# Patient Record
Sex: Female | Born: 1955 | Race: White | Hispanic: No | Marital: Married | Smoking: Never smoker
Health system: Southern US, Community
[De-identification: ages and names within clinical notes are randomized; demographics above are authoritative.]

## PROBLEM LIST (undated history)

## (undated) DIAGNOSIS — E7800 Pure hypercholesterolemia, unspecified: Secondary | ICD-10-CM

## (undated) HISTORY — PX: ANKLE ARTHROSCOPY W/ OPEN REPAIR: SHX1145

## (undated) HISTORY — PX: BILATERAL CARPAL TUNNEL RELEASE: SHX6508

---

## 2000-10-15 ENCOUNTER — Other Ambulatory Visit: Admission: RE | Admit: 2000-10-15 | Discharge: 2000-10-15 | Payer: Self-pay | Admitting: Gynecology

## 2001-10-16 ENCOUNTER — Other Ambulatory Visit: Admission: RE | Admit: 2001-10-16 | Discharge: 2001-10-16 | Payer: Self-pay | Admitting: Gynecology

## 2002-11-18 ENCOUNTER — Other Ambulatory Visit: Admission: RE | Admit: 2002-11-18 | Discharge: 2002-11-18 | Payer: Self-pay | Admitting: Gynecology

## 2003-12-15 ENCOUNTER — Other Ambulatory Visit: Admission: RE | Admit: 2003-12-15 | Discharge: 2003-12-15 | Payer: Self-pay | Admitting: Gynecology

## 2005-05-22 ENCOUNTER — Other Ambulatory Visit: Admission: RE | Admit: 2005-05-22 | Discharge: 2005-05-22 | Payer: Self-pay | Admitting: Gynecology

## 2006-04-17 ENCOUNTER — Other Ambulatory Visit: Admission: RE | Admit: 2006-04-17 | Discharge: 2006-04-17 | Payer: Self-pay | Admitting: Gynecology

## 2007-05-26 ENCOUNTER — Other Ambulatory Visit: Admission: RE | Admit: 2007-05-26 | Discharge: 2007-05-26 | Payer: Self-pay | Admitting: Gynecology

## 2008-09-16 ENCOUNTER — Ambulatory Visit (HOSPITAL_BASED_OUTPATIENT_CLINIC_OR_DEPARTMENT_OTHER): Admission: RE | Admit: 2008-09-16 | Discharge: 2008-09-16 | Payer: Self-pay | Admitting: Gynecology

## 2008-09-16 ENCOUNTER — Encounter (INDEPENDENT_AMBULATORY_CARE_PROVIDER_SITE_OTHER): Payer: Self-pay | Admitting: Gynecology

## 2010-08-29 ENCOUNTER — Other Ambulatory Visit: Payer: Self-pay | Admitting: Gynecology

## 2010-09-20 LAB — POCT HEMOGLOBIN-HEMACUE: Hemoglobin: 14.9 g/dL (ref 12.0–15.0)

## 2010-09-20 LAB — POCT PREGNANCY, URINE: Preg Test, Ur: NEGATIVE

## 2010-10-05 ENCOUNTER — Other Ambulatory Visit: Payer: Self-pay | Admitting: Gastroenterology

## 2010-10-24 NOTE — Op Note (Signed)
Jenny Lucas, Jenny Lucas                  ACCOUNT NO.:  [id number]   MEDICAL RECORD NO.:  [id number]          PATIENT TYPE:  AMB   LOCATION:  [facility]                         FACILITY:  [facility]   PHYSICIAN:  Gretta Cool, M.D. DATE OF BIRTH:  1956-04-23   DATE OF PROCEDURE:  09/16/2008  DATE OF DISCHARGE:                               OPERATIVE REPORT   PREOPERATIVE DIAGNOSIS:  Endometrial polyp with recurrent abnormal  uterine bleeding, unresponsive.   POSTOPERATIVE DIAGNOSIS:  Endometrial polyp with recurrent abnormal  uterine bleeding, unresponsive.   PROCEDURE:  Hysteroscopy, resection of the endometrial polyp from the  lower uterine segment, total endometrial resection for ablation, plus  VaporTrode ablation.   SURGEON:  Beather Arbour.   ANESTHESIA:  IV sedation and paracervical block.   DESCRIPTION OF PROCEDURE:  Under excellent anesthesia as above with the  patient prepped and draped in Allen stirrups with her bladder drained,  cervix was grasped with a single-tooth tenaculum.  Weighted speculum was  placed in the posterior vagina and the cervix progressively dilated with  a series of Pratt dilators.  The 7-mm resectoscope was then introduced  and the endometrial cavity photographed.  There was a polyp at the  anterior [place] just inside the lower uterine segment.  That polyp was  resected.  The entire endometrial cavity was then resected.  The cavity  was then treated by VaporTrode so as to eliminate any surface or  superficial adenomyosis.  At this point, the procedure was terminated  without complication.  There was no significant bleeding that reduced  pressure.  The patient was returned to the recovery room in excellent  condition.           ______________________________  Gretta Cool, M.D.     CWL/MEDQ  D:  09/16/2008  T:  09/16/2008  Job:  [id number]   cc:   Gretta Cool, M.D.  Fax: [phone]

## 2011-09-18 ENCOUNTER — Other Ambulatory Visit: Payer: Self-pay | Admitting: Gynecology

## 2017-11-01 DIAGNOSIS — G5603 Carpal tunnel syndrome, bilateral upper limbs: Secondary | ICD-10-CM | POA: Insufficient documentation

## 2020-06-20 ENCOUNTER — Other Ambulatory Visit: Payer: Self-pay

## 2020-06-20 ENCOUNTER — Emergency Department
Admission: EM | Admit: 2020-06-20 | Discharge: 2020-06-20 | Disposition: A | Discharge status: Home / Self Care | Payer: BLUE CROSS/BLUE SHIELD | Source: Home / Self Care

## 2020-06-20 DIAGNOSIS — J069 Acute upper respiratory infection, unspecified: Secondary | ICD-10-CM

## 2020-06-20 DIAGNOSIS — R52 Pain, unspecified: Secondary | ICD-10-CM

## 2020-06-20 DIAGNOSIS — J020 Streptococcal pharyngitis: Secondary | ICD-10-CM | POA: Diagnosis not present

## 2020-06-20 LAB — POCT RAPID STREP A (OFFICE): Rapid Strep A Screen: POSITIVE — AB

## 2020-06-20 MED ORDER — AMOXICILLIN 500 MG PO CAPS: 500.0000 mg | ORAL_CAPSULE | Freq: Two times a day (BID) | ORAL | 0 refills | Status: AC

## 2020-06-20 NOTE — ED Triage Notes (Signed)
Pt c/o sore throat, fever (101 yesterday) and cough since Saturday afternoon. No known covid exposure. Has not had covid vaccines. Advil prn.

## 2020-06-20 NOTE — Discharge Instructions (Addendum)

## 2020-06-20 NOTE — ED Provider Notes (Signed)
[facility]    CSN: [id number] Arrival date & time: 06/20/20  0846      History   Chief Complaint Chief Complaint  Patient presents with  . Sore Throat  . Fever  . Cough    HPI Jenny Lucas is a 65 y.o. female.   HPI  Jenny Skyles is a 65 y.o. female presenting to UC with c/o sore throat, fever 101*F yesterday, cough for 3 days.  No known sick contacts. Has not had COVID vaccine. Denies n/v/d. No chest pain or SOB.    History reviewed. No pertinent past medical history.  There are no problems to display for this patient.   History reviewed. No pertinent surgical history.  OB History   No obstetric history on file.      Home Medications    Prior to Admission medications   Medication Sig Start Date End Date Taking? Authorizing Provider  amoxicillin (AMOXIL) 500 MG capsule Take 1 capsule (500 mg total) by mouth 2 (two) times daily for 10 days. 06/20/20 06/30/20 Yes Jonathyn Carothers O, PA-C  PARoxetine (PAXIL) 20 MG tablet Take 1 tablet by mouth daily. 04/18/20  Yes [provider]  rosuvastatin (CRESTOR) 20 MG tablet Take by mouth. 04/18/20  Yes [provider]    Family History History reviewed. No pertinent family history.  Social History Social History   Tobacco Use  . Smoking status: Never Smoker  . Smokeless tobacco: Never Used  Vaping Use  . Vaping Use: Never used  Substance Use Topics  . Alcohol use: Not Currently     Allergies   Patient has no known allergies.   Review of Systems Review of Systems  Constitutional: Positive for fatigue. Negative for chills and fever.  HENT: Positive for congestion and sore throat. Negative for ear pain, trouble swallowing and voice change.   Respiratory: Positive for cough. Negative for shortness of breath.   Cardiovascular: Negative for chest pain and palpitations.  Gastrointestinal: Negative for abdominal pain, diarrhea, nausea and vomiting.  Musculoskeletal: Negative for arthralgias,  back pain and myalgias.  Skin: Negative for rash.  Neurological: Positive for headaches. Negative for dizziness and light-headedness.  All other systems reviewed and are negative.    Physical Exam Triage Vital Signs ED Triage Vitals  Enc Vitals Group     BP 06/20/20 0908 138/87     Pulse Rate 06/20/20 0908 91     Resp 06/20/20 0908 17     Temp 06/20/20 0908 99.2 F (37.3 C)     Temp Source 06/20/20 0908 Oral     SpO2 06/20/20 0908 97 %     Weight --      Height --      Head Circumference --      Peak Flow --      Pain Score 06/20/20 0905 8     Pain Loc --      Pain Edu? --      Excl. in GC? --    No data found.  Updated Vital Signs BP 138/87 (BP Location: Right Arm)   Pulse 91   Temp 99.2 F (37.3 C) (Oral)   Resp 17   SpO2 97%   Visual Acuity Right Eye Distance:   Left Eye Distance:   Bilateral Distance:    Right Eye Near:   Left Eye Near:    Bilateral Near:     Physical Exam Vitals and nursing note reviewed.  Constitutional:      General: She is  not in acute distress.    Appearance: She is well-developed and well-nourished. She is not ill-appearing, toxic-appearing or diaphoretic.  HENT:     Head: Normocephalic and atraumatic.     Right Ear: Tympanic membrane and ear canal normal.     Left Ear: Tympanic membrane and ear canal normal.     Nose: Nose normal.     Right Sinus: No maxillary sinus tenderness or frontal sinus tenderness.     Left Sinus: No maxillary sinus tenderness or frontal sinus tenderness.     Mouth/Throat:     Lips: Pink.     Mouth: Mucous membranes are moist.     Pharynx: Oropharynx is clear. Uvula midline. Posterior oropharyngeal erythema present. No pharyngeal swelling, oropharyngeal exudate or uvula swelling.     Tonsils: No tonsillar exudate or tonsillar abscesses.  Eyes:     Extraocular Movements: EOM normal.  Cardiovascular:     Rate and Rhythm: Normal rate and regular rhythm.  Pulmonary:     Effort: Pulmonary effort is  normal. No respiratory distress.     Breath sounds: Normal breath sounds. No stridor. No wheezing, rhonchi or rales.  Musculoskeletal:        General: Normal range of motion.     Cervical back: Normal range of motion and neck supple.  Lymphadenopathy:     Cervical: Cervical adenopathy present.  Skin:    General: Skin is warm and dry.  Neurological:     Mental Status: She is alert and oriented to person, place, and time.  Psychiatric:        Mood and Affect: Mood and affect normal.        Behavior: Behavior normal.      UC Treatments / Results  Labs (all labs ordered are listed, but only abnormal results are displayed) Labs Reviewed  POCT RAPID STREP A (OFFICE) - Abnormal; Notable for the following components:      Result Value   Rapid Strep A Screen Positive (*)    All other components within normal limits  SARS-COV-2 RNA,(COVID-19) QUALITATIVE NAAT    EKG   Radiology No results found.  Procedures Procedures (including critical care time)  Medications Ordered in UC Medications - No data to display  Initial Impression / Assessment and Plan / UC Course  I have reviewed the triage vital signs and the nursing notes.  Pertinent labs & imaging results that were available during my care of the patient were reviewed by me and considered in my medical decision making (see chart for details).     Rapid strep: POSITIVE COVID PCR pending Rx: amoxicillin F/u with PCP as needed  Final Clinical Impressions(s) / UC Diagnoses   Final diagnoses:  Body aches  Strep pharyngitis  URI with cough and congestion     Discharge Instructions      Please take antibiotics as prescribed and be sure to complete entire course even if you start to feel better to ensure infection does not come back.  You may take 500mg  acetaminophen every 4-6 hours or in combination with ibuprofen 400-600mg  every 6-8 hours as needed for pain, inflammation, and fever.  Be sure to well hydrated with  clear liquids and get at least 8 hours of sleep at night, preferably more while sick.   Please follow up with family medicine in 1 week if needed.     ED Prescriptions    Medication Sig Dispense Auth. Provider   amoxicillin (AMOXIL) 500 MG capsule Take 1 capsule (500 mg  total) by mouth 2 (two) times daily for 10 days. 20 capsule Lurene Shadow, PA-C     PDMP not reviewed this encounter.   Lurene Shadow, [place] 06/20/20 2113

## 2020-06-21 ENCOUNTER — Ambulatory Visit: Payer: Self-pay

## 2020-06-22 LAB — SARS-COV-2 RNA,(COVID-19) QUALITATIVE NAAT: SARS CoV2 RNA: DETECTED — AB

## 2020-08-09 DIAGNOSIS — U071 COVID-19: Secondary | ICD-10-CM

## 2020-08-09 HISTORY — DX: COVID-19: U07.1

## 2020-12-02 ENCOUNTER — Emergency Department (INDEPENDENT_AMBULATORY_CARE_PROVIDER_SITE_OTHER)
Admission: EM | Admit: 2020-12-02 | Discharge: 2020-12-02 | Disposition: A | Discharge status: Home / Self Care | Payer: BLUE CROSS/BLUE SHIELD | Source: Home / Self Care

## 2020-12-02 ENCOUNTER — Emergency Department (INDEPENDENT_AMBULATORY_CARE_PROVIDER_SITE_OTHER): Payer: BLUE CROSS/BLUE SHIELD

## 2020-12-02 ENCOUNTER — Other Ambulatory Visit: Payer: Self-pay

## 2020-12-02 ENCOUNTER — Encounter: Payer: Self-pay | Admitting: Emergency Medicine

## 2020-12-02 DIAGNOSIS — R058 Other specified cough: Secondary | ICD-10-CM | POA: Diagnosis not present

## 2020-12-02 DIAGNOSIS — J309 Allergic rhinitis, unspecified: Secondary | ICD-10-CM

## 2020-12-02 DIAGNOSIS — J40 Bronchitis, not specified as acute or chronic: Secondary | ICD-10-CM | POA: Diagnosis not present

## 2020-12-02 DIAGNOSIS — E7800 Pure hypercholesterolemia, unspecified: Secondary | ICD-10-CM | POA: Insufficient documentation

## 2020-12-02 DIAGNOSIS — R059 Cough, unspecified: Secondary | ICD-10-CM

## 2020-12-02 DIAGNOSIS — F419 Anxiety disorder, unspecified: Secondary | ICD-10-CM | POA: Insufficient documentation

## 2020-12-02 MED ORDER — FEXOFENADINE HCL 180 MG PO TABS: 180.0000 mg | ORAL_TABLET | Freq: Every day | ORAL | 0 refills | Status: DC

## 2020-12-02 MED ORDER — METHYLPREDNISOLONE 4 MG PO TBPK: ORAL_TABLET | ORAL | 0 refills | Status: DC

## 2020-12-02 MED ORDER — METHYLPREDNISOLONE ACETATE 80 MG/ML IJ SUSP
80.0000 mg | Freq: Once | INTRAMUSCULAR | Status: AC
  Administered 2020-12-02: 80 mg via INTRAMUSCULAR

## 2020-12-02 NOTE — ED Triage Notes (Signed)
Productive cough x 1.5 weeks  Zyrtec OTC  No COVID vaccine  Pt c/o swollen lymph nodes on right side of neck  Dizziness x 1 week COVID w/ strep this past winter

## 2020-12-02 NOTE — ED Provider Notes (Signed)
[facility]    CSN: [id number] Arrival date & time: 12/02/20  1302      History   Chief Complaint Chief Complaint  Patient presents with   Cough    HPI Jenny Lucas is a 65 y.o. female.   HPI 65 year old female presents with productive cough for 1.5 weeks.  Patient reports intermittent dizziness during this time as well as swollen lymph nodes on right side of neck.  Patient is unvaccinated for COVID-19.  Past Medical History:  Diagnosis Date   COVID-19 08/2020    Patient Active Problem List   Diagnosis Date Noted   Anxiety 12/02/2020   Hypercholesterolemia 12/02/2020   Bilateral carpal tunnel syndrome 11/01/2017    Past Surgical History:  Procedure Laterality Date   BILATERAL CARPAL TUNNEL RELEASE Bilateral     OB History   No obstetric history on file.      Home Medications    Prior to Admission medications   Medication Sig Start Date End Date Taking? Authorizing Provider  fexofenadine (ALLEGRA ALLERGY) 180 MG tablet Take 1 tablet (180 mg total) by mouth daily for 15 days. 12/02/20 12/17/20 Yes Trevor Iha, FNP  methylPREDNISolone (MEDROL DOSEPAK) 4 MG TBPK tablet Take as directed 12/02/20  Yes Trevor Iha, FNP  albuterol (VENTOLIN HFA) 108 (90 Base) MCG/ACT inhaler Inhale into the lungs. 08/22/20   [provider]  Cholecalciferol 25 MCG (1000 UT) tablet Take by mouth.    [provider]  PARoxetine (PAXIL) 20 MG tablet Take 1 tablet by mouth daily. 04/18/20   [provider]  rosuvastatin (CRESTOR) 20 MG tablet Take by mouth. 04/18/20   [provider]    Family History Family History  Problem Relation Age of Onset   Healthy Mother    Lymphoma Father     Social History Social History   Tobacco Use   Smoking status: Never   Smokeless tobacco: Never  Vaping Use   Vaping Use: Never used  Substance Use Topics   Alcohol use: Not Currently   Drug use: Never     Allergies   Patient has no known  allergies.   Review of Systems Review of Systems  Constitutional: Negative.   HENT: Negative.    Eyes: Negative.   Respiratory:  Positive for cough.   Cardiovascular: Negative.   Gastrointestinal: Negative.   Genitourinary: Negative.   Musculoskeletal: Negative.   Skin: Negative.   Neurological:  Positive for dizziness.    Physical Exam Triage Vital Signs ED Triage Vitals  Enc Vitals Group     BP 12/02/20 1341 132/86     Pulse Rate 12/02/20 1341 81     Resp 12/02/20 1341 16     Temp 12/02/20 1341 99.1 F (37.3 C)     Temp Source 12/02/20 1341 Oral     SpO2 12/02/20 1341 94 %     Weight 12/02/20 1342 150 lb (68 kg)     Height 12/02/20 1342 5\' 5"  (1.651 m)     Head Circumference --      Peak Flow --      Pain Score 12/02/20 1342 3     Pain Loc --      Pain Edu? --      Excl. in GC? --    No data found.  Updated Vital Signs BP 132/86 (BP Location: Right Arm)   Pulse 81   Temp 99.1 F (37.3 C) (Oral)   Resp 16   Ht 5\' 5"  (1.651 m)  Wt 150 lb (68 kg)   SpO2 94%   BMI 24.96 kg/m      Physical Exam Vitals and nursing note reviewed.  Constitutional:      General: She is not in acute distress.    Appearance: Normal appearance. She is normal weight. She is not ill-appearing.  HENT:     Head: Normocephalic and atraumatic.     Right Ear: Tympanic membrane and ear canal normal.     Left Ear: Tympanic membrane and ear canal normal.     Nose: Nose normal. No congestion or rhinorrhea.     Mouth/Throat:     Mouth: Mucous membranes are moist.     Pharynx: Oropharynx is clear. No oropharyngeal exudate or posterior oropharyngeal erythema.     Comments: Moderate amount of clear drainage of posterior oropharynx noted Eyes:     Extraocular Movements: Extraocular movements intact.     Conjunctiva/sclera: Conjunctivae normal.     Pupils: Pupils are equal, round, and reactive to light.  Cardiovascular:     Rate and Rhythm: Normal rate and regular rhythm.     Pulses:  Normal pulses.     Heart sounds: Normal heart sounds.  Pulmonary:     Effort: Pulmonary effort is normal. No respiratory distress.     Breath sounds: No stridor. Rhonchi present. No wheezing or rales.     Comments: Moderate diffuse scattered rhonchi noted throughout, decreased air entry/diminished breath sounds bibasilarly Musculoskeletal:        General: Normal range of motion.     Cervical back: Normal range of motion and neck supple. Tenderness present.  Lymphadenopathy:     Cervical: Cervical adenopathy present.  Skin:    General: Skin is warm and dry.  Neurological:     General: No focal deficit present.     Mental Status: She is alert and oriented to person, place, and time.  Psychiatric:        Mood and Affect: Mood normal.        Behavior: Behavior normal.     UC Treatments / Results  Labs (all labs ordered are listed, but only abnormal results are displayed) Labs Reviewed - No data to display  EKG   Radiology DG Chest 2 View  Result Date: 12/02/2020 CLINICAL DATA:  Productive cough for 1 and half weeks. EXAM: CHEST - 2 VIEW COMPARISON:  None. FINDINGS: Normal heart, mediastinum and hila. Lungs are hyperexpanded, but clear. No pleural effusion or pneumothorax. Skeletal structures are intact. IMPRESSION: No active cardiopulmonary disease. Electronically Signed   By: Amie Portland M.D.   On: 12/02/2020 15:07    Procedures Procedures (including critical care time)  Medications Ordered in UC Medications  methylPREDNISolone acetate (DEPO-MEDROL) injection 80 mg (80 mg Intramuscular Given 12/02/20 1541)    Initial Impression / Assessment and Plan / UC Course  I have reviewed the triage vital signs and the nursing notes.  Pertinent labs & imaging results that were available during my care of the patient were reviewed by me and considered in my medical decision making (see chart for details).     MDM: 1.  Cough, 2.  Bronchitis  3.  Allergic rhinitis Final Clinical  Impressions(s) / UC Diagnoses   Final diagnoses:  Cough  Allergic rhinitis, unspecified seasonality, unspecified trigger  Bronchitis     Discharge Instructions      Advised patient to take medication as directed with food to completion.  Advised/instructed patient to take Allegra 180 mg daily for the next  5 days, then as needed for concurrent postnasal drainage/drip.  Advised/instructed patient not to start Medrol Dosepak until tomorrow morning, Saturday, 12/03/2020.     ED Prescriptions     Medication Sig Dispense Auth. Provider   fexofenadine (ALLEGRA ALLERGY) 180 MG tablet Take 1 tablet (180 mg total) by mouth daily for 15 days. 15 tablet Trevor Iha, FNP   methylPREDNISolone (MEDROL DOSEPAK) 4 MG TBPK tablet Take as directed 1 each Trevor Iha, FNP      PDMP not reviewed this encounter.   Trevor Iha, FNP 12/02/20 1559

## 2020-12-02 NOTE — Discharge Instructions (Addendum)
Advised patient to take medication as directed with food to completion.  Advised/instructed patient to take Allegra 180 mg daily for the next 5 days, then as needed for concurrent postnasal drainage/drip.  Advised/instructed patient not to start Medrol Dosepak until tomorrow morning, Saturday, 12/03/2020.

## 2021-05-03 ENCOUNTER — Emergency Department
Admission: EM | Admit: 2021-05-03 | Discharge: 2021-05-03 | Disposition: A | Discharge status: Home / Self Care | Payer: Medicare Other | Source: Home / Self Care

## 2021-05-03 ENCOUNTER — Other Ambulatory Visit: Payer: Self-pay

## 2021-05-03 ENCOUNTER — Encounter: Payer: Self-pay | Admitting: Emergency Medicine

## 2021-05-03 DIAGNOSIS — R52 Pain, unspecified: Secondary | ICD-10-CM

## 2021-05-03 LAB — POCT FASTING CBG KUC MANUAL ENTRY: POCT Glucose (KUC): 134 mg/dL — AB (ref 70–99)

## 2021-05-03 MED ORDER — ONDANSETRON 8 MG PO TBDP
8.0000 mg | ORAL_TABLET | Freq: Once | ORAL | Status: AC
  Administered 2021-05-03: 8 mg via ORAL

## 2021-05-03 NOTE — ED Provider Notes (Signed)
[facility]    CSN: [id number] Arrival date & time: 05/03/21  0951      History   Chief Complaint Chief Complaint  Patient presents with   Generalized Body Aches   Cough    HPI Jenny Lucas is a 65 y.o. female.   HPI 65 year old female presents with body aches, cough, fever, sore throat for 3 days.  While checking in at front desk patient became lightheaded and faint she was able to sit down.  Patient began to vomit (very small amount) and was brought by wheelchair into our procedure room where she now stable.  Patient was given Zofran 8 mg once in clinic.  Past Medical History:  Diagnosis Date   COVID-19 08/2020    Patient Active Problem List   Diagnosis Date Noted   Anxiety 12/02/2020   Hypercholesterolemia 12/02/2020   Bilateral carpal tunnel syndrome 11/01/2017    Past Surgical History:  Procedure Laterality Date   BILATERAL CARPAL TUNNEL RELEASE Bilateral     OB History   No obstetric history on file.      Home Medications    Prior to Admission medications   Medication Sig Start Date End Date Taking? Authorizing Provider  PARoxetine (PAXIL) 20 MG tablet Take 1 tablet by mouth daily. 04/18/20  Yes [provider]  rosuvastatin (CRESTOR) 20 MG tablet Take by mouth. 04/18/20  Yes [provider]  albuterol (VENTOLIN HFA) 108 (90 Base) MCG/ACT inhaler Inhale into the lungs. 08/22/20   [provider]  Cholecalciferol 25 MCG (1000 UT) tablet Take by mouth.    [provider]  fexofenadine (ALLEGRA ALLERGY) 180 MG tablet Take 1 tablet (180 mg total) by mouth daily for 15 days. 12/02/20 12/17/20  Trevor Iha, FNP  methylPREDNISolone (MEDROL DOSEPAK) 4 MG TBPK tablet Take as directed 12/02/20   Trevor Iha, FNP    Family History Family History  Problem Relation Age of Onset   Healthy Mother    Lymphoma Father     Social History Social History   Tobacco Use   Smoking status: Never   Smokeless tobacco:  Never  Vaping Use   Vaping Use: Never used  Substance Use Topics   Alcohol use: Not Currently   Drug use: Never     Allergies   Patient has no known allergies.   Review of Systems Review of Systems  Constitutional:  Positive for fever.  HENT:  Positive for sore throat.   Respiratory:  Positive for cough.   Gastrointestinal:  Positive for vomiting.  Musculoskeletal:  Positive for myalgias.  All other systems reviewed and are negative.   Physical Exam Triage Vital Signs ED Triage Vitals  Enc Vitals Group     BP 05/03/21 1008 132/81     Pulse Rate 05/03/21 1008 91     Resp 05/03/21 1008 18     Temp 05/03/21 1008 99.8 F (37.7 C)     Temp Source 05/03/21 1008 Oral     SpO2 05/03/21 1008 96 %     Weight --      Height --      Head Circumference --      Peak Flow --      Pain Score 05/03/21 1011 0     Pain Loc --      Pain Edu? --      Excl. in GC? --    No data found.  Updated Vital Signs BP 132/81 (BP Location: Right Arm)   Pulse 91  Temp 99.8 F (37.7 C) (Oral)   Resp 18   SpO2 96%   Physical Exam Vitals and nursing note reviewed.  Constitutional:      General: She is not in acute distress.    Appearance: Normal appearance. She is normal weight. She is not ill-appearing.  HENT:     Head: Normocephalic and atraumatic.     Right Ear: Tympanic membrane, ear canal and external ear normal.     Left Ear: Tympanic membrane, ear canal and external ear normal.     Mouth/Throat:     Mouth: Mucous membranes are moist.     Pharynx: Oropharynx is clear.  Eyes:     Extraocular Movements: Extraocular movements intact.     Conjunctiva/sclera: Conjunctivae normal.     Pupils: Pupils are equal, round, and reactive to light.  Cardiovascular:     Rate and Rhythm: Normal rate and regular rhythm.     Pulses: Normal pulses.     Heart sounds: Normal heart sounds.  Pulmonary:     Effort: Pulmonary effort is normal.     Breath sounds: Normal breath sounds.   Musculoskeletal:        General: Normal range of motion.     Cervical back: Normal range of motion and neck supple.  Skin:    General: Skin is warm and dry.  Neurological:     General: No focal deficit present.     Mental Status: She is alert and oriented to person, place, and time.     UC Treatments / Results  Labs (all labs ordered are listed, but only abnormal results are displayed) Labs Reviewed  POCT FASTING CBG [facility] MANUAL ENTRY - Abnormal; Notable for the following components:      Result Value   POCT Glucose ([facility]) 134 (*)    All other components within normal limits  COVID-19, FLU A+B NAA    EKG   Radiology No results found.  Procedures Procedures (including critical care time)  Medications Ordered in UC Medications  ondansetron (ZOFRAN-ODT) disintegrating tablet 8 mg (8 mg Oral Given 05/03/21 1009)    Initial Impression / Assessment and Plan / UC Course  I have reviewed the triage vital signs and the nursing notes.  Pertinent labs & imaging results that were available during my care of the patient were reviewed by me and considered in my medical decision making (see chart for details).     MDM: 1.  Generalized body aches-COVID-19, flu A/B ordered. Advised conservative measures for now may alternate between OTC Tylenol 1000 mg 1-2 times daily, as needed with OTC Ibuprofen 400 to 600 mg 1-2 times daily, as needed encouraged patient to increase daily water intake.  Advised patient we will follow-up with COVID-19, flu A/B results once received.  Patient discharged home, hemodynamically stable. Final Clinical Impressions(s) / UC Diagnoses   Final diagnoses:  Generalized body aches     Discharge Instructions      Advised conservative measures for now may alternate between OTC Tylenol 1000 mg 1-2 times daily, as needed with OTC Ibuprofen 400 to 600 mg 1-2 times daily, as needed encouraged patient to increase daily water intake.  Advised patient we will  follow-up with COVID-19, flu A/B results once received.     ED Prescriptions   None    PDMP not reviewed this encounter.   Trevor Iha, FNP 05/03/21 1116

## 2021-05-03 NOTE — ED Triage Notes (Addendum)
Patient presents to Urgent Care with complaints of body aches, cough, fever, sore throat since 3 days ago. Patient reports fever, cough, body aches. Took Advil and Zyrtec. No known sick contacts..   While patient was checking in at the front desk. She became light headed and faint. She was able to sick down. She started becoming hot and started vomiting. We were able to ambulate patient into a wheelchair and proceed into the room.

## 2021-05-03 NOTE — Discharge Instructions (Addendum)
Advised conservative measures for now may alternate between OTC Tylenol 1000 mg 1-2 times daily, as needed with OTC Ibuprofen 400 to 600 mg 1-2 times daily, as needed encouraged patient to increase daily water intake.  Advised patient we will follow-up with COVID-19, flu A/B results once received.

## 2021-05-05 LAB — COVID-19, FLU A+B NAA
Influenza A, NAA: DETECTED — AB
Influenza B, NAA: NOT DETECTED
SARS-CoV-2, NAA: NOT DETECTED

## 2021-07-19 ENCOUNTER — Emergency Department
Admission: EM | Admit: 2021-07-19 | Discharge: 2021-07-19 | Disposition: A | Discharge status: Home / Self Care | Payer: No Typology Code available for payment source | Source: Home / Self Care | Attending: Family Medicine | Admitting: Family Medicine

## 2021-07-19 ENCOUNTER — Other Ambulatory Visit: Payer: Self-pay

## 2021-07-19 DIAGNOSIS — U071 COVID-19: Secondary | ICD-10-CM | POA: Diagnosis not present

## 2021-07-19 LAB — POC SARS CORONAVIRUS 2 AG -  ED: SARS Coronavirus 2 Ag: POSITIVE — AB

## 2021-07-19 MED ORDER — PAXLOVID (300/100) 20 X 150 MG & 10 X 100MG PO TBPK: 1.0000 | ORAL_TABLET | Freq: Two times a day (BID) | ORAL | 0 refills | Status: DC

## 2021-07-19 NOTE — Discharge Instructions (Addendum)
Do not take your cholesterol medication while you take paxlovid  Take paxlovid 2 times a day for 5 days  Quarantine at home for 5 days, then wear a mask for an additional 5 days  Call for problems

## 2021-07-19 NOTE — ED Triage Notes (Signed)
Pt here today stating found out about a recent covid exposure that occurred Sunday. Last night started having congestion and bodyaches. Zyrtec prn.

## 2021-07-19 NOTE — ED Provider Notes (Signed)
[facility]    CSN: [id number] Arrival date & time: 07/19/21  1514      History   Chief Complaint Chief Complaint  Patient presents with   Covid Exposure   Nasal Congestion   Generalized Body Aches    HPI Jenny Lucas is a 66 y.o. female.   HPI  Patient states that her daughter has COVID.  She was exposed to her couple of days ago.  Last night she developed some nasal congestion body aches and fatigue.  She is here today for evaluation. She is 66 years old.  Healthy.  She is on rosuvastatin for cholesterol.  Non-smoker.  Not overweight  Past Medical History:  Diagnosis Date   COVID-19 08/2020    Patient Active Problem List   Diagnosis Date Noted   Anxiety 12/02/2020   Hypercholesterolemia 12/02/2020   Bilateral carpal tunnel syndrome 11/01/2017    Past Surgical History:  Procedure Laterality Date   BILATERAL CARPAL TUNNEL RELEASE Bilateral     OB History   No obstetric history on file.      Home Medications    Prior to Admission medications   Medication Sig Start Date End Date Taking? Authorizing Provider  nirmatrelvir & ritonavir (PAXLOVID, 300/100,) 20 x 150 MG & 10 x 100MG  TBPK Take 1 tablet by mouth 2 (two) times daily. 07/19/21  Yes 09/16/21, MD  albuterol (VENTOLIN HFA) 108 (90 Base) MCG/ACT inhaler Inhale into the lungs. 08/22/20   [provider]  Cholecalciferol 25 MCG (1000 UT) tablet Take by mouth.    [provider]  fexofenadine (ALLEGRA ALLERGY) 180 MG tablet Take 1 tablet (180 mg total) by mouth daily for 15 days. 12/02/20 12/17/20  02/17/21, FNP  PARoxetine (PAXIL) 20 MG tablet Take 1 tablet by mouth daily. 04/18/20   [provider]  rosuvastatin (CRESTOR) 20 MG tablet Take by mouth. 04/18/20   [provider]    Family History Family History  Problem Relation Age of Onset   Healthy Mother    Lymphoma Father     Social History Social History   Tobacco Use   Smoking  status: Never   Smokeless tobacco: Never  Vaping Use   Vaping Use: Never used  Substance Use Topics   Alcohol use: Not Currently   Drug use: Never     Allergies   Patient has no known allergies.   Review of Systems Review of Systems See HPI  Physical Exam Triage Vital Signs ED Triage Vitals  Enc Vitals Group     BP 07/19/21 1526 134/77     Pulse Rate 07/19/21 1526 90     Resp 07/19/21 1526 18     Temp 07/19/21 1526 99 F (37.2 C)     Temp Source 07/19/21 1526 Oral     SpO2 07/19/21 1526 99 %     Weight --      Height --      Head Circumference --      Peak Flow --      Pain Score 07/19/21 1528 0     Pain Loc --      Pain Edu? --      Excl. in GC? --    No data found.  Updated Vital Signs BP 134/77 (BP Location: Right Arm)    Pulse 90    Temp 99 F (37.2 C) (Oral)    Resp 18    SpO2 99%     Physical Exam Constitutional:  General: She is not in acute distress.    Appearance: Normal appearance. She is well-developed.     Comments: Appears well.  Examined by observation.  HENT:     Head: Normocephalic and atraumatic.  Eyes:     Conjunctiva/sclera: Conjunctivae normal.     Pupils: Pupils are equal, round, and reactive to light.  Cardiovascular:     Rate and Rhythm: Normal rate.  Pulmonary:     Effort: Pulmonary effort is normal. No respiratory distress.  Abdominal:     Palpations: Abdomen is soft.  Musculoskeletal:        General: Normal range of motion.     Cervical back: Normal range of motion.  Skin:    General: Skin is warm and dry.  Neurological:     Mental Status: She is alert.     UC Treatments / Results  Labs (all labs ordered are listed, but only abnormal results are displayed) Labs Reviewed  POC SARS CORONAVIRUS 2 AG -  ED - Abnormal; Notable for the following components:      Result Value   SARS Coronavirus 2 Ag Positive (*)    All other components within normal limits    EKG   Radiology No results  found.  Procedures Procedures (including critical care time)  Medications Ordered in UC Medications - No data to display  Initial Impression / Assessment and Plan / UC Course  I have reviewed the triage vital signs and the nursing notes.  Pertinent labs & imaging results that were available during my care of the patient were reviewed by me and considered in my medical decision making (see chart for details).     Patient has COVID.  We discussed that because she is Over65 she qualifies for paxlovid.  I checked her blood work and her last GFR was 73.  She is vaccinated.  Final Clinical Impressions(s) / UC Diagnoses   Final diagnoses:  COVID-19     Discharge Instructions      Do not take your cholesterol medication while you take paxlovid  Take paxlovid 2 times a day for 5 days  Quarantine at home for 5 days, then wear a mask for an additional 5 days  Call for problems   ED Prescriptions     Medication Sig Dispense Auth. Provider   nirmatrelvir & ritonavir (PAXLOVID, 300/100,) 20 x 150 MG & 10 x 100MG  TBPK Take 1 tablet by mouth 2 (two) times daily. 1 each , MD      PDMP not reviewed this encounter.   Eustace Moore, MD 07/19/21 [phone]

## 2021-10-25 ENCOUNTER — Emergency Department
Admission: EM | Admit: 2021-10-25 | Discharge: 2021-10-25 | Disposition: A | Discharge status: Home / Self Care | Payer: No Typology Code available for payment source | Source: Home / Self Care | Attending: Family Medicine | Admitting: Family Medicine

## 2021-10-25 ENCOUNTER — Emergency Department (INDEPENDENT_AMBULATORY_CARE_PROVIDER_SITE_OTHER): Payer: No Typology Code available for payment source

## 2021-10-25 DIAGNOSIS — M1811 Unilateral primary osteoarthritis of first carpometacarpal joint, right hand: Secondary | ICD-10-CM | POA: Diagnosis not present

## 2021-10-25 DIAGNOSIS — M25531 Pain in right wrist: Secondary | ICD-10-CM | POA: Diagnosis not present

## 2021-10-25 DIAGNOSIS — S63501A Unspecified sprain of right wrist, initial encounter: Secondary | ICD-10-CM

## 2021-10-25 NOTE — ED Triage Notes (Signed)
Pt c/o RT wrist pain since falling on it while playing with grandkids. Pain 4/10 Swelling noted. Advil prn.  ?

## 2021-10-25 NOTE — ED Provider Notes (Signed)
?[place] ? ? ? ?CSN: [id number] ?Arrival date & time: 10/25/21  1043 ? ? ?  ? ?History   ?Chief Complaint ?Chief Complaint  ?Patient presents with  ? Wrist Pain  ?  Injury, RT  ? ? ?HPI ?Jenny Lucas is a 66 y.o. female.  ? ?HPI ? ?Patient fell yesterday and injured her wrist.  It is swollen discolored and painful.  She is here for evaluation.  She does have known osteopenia.  Is not currently on medication. ? ?Past Medical History:  ?Diagnosis Date  ? COVID-19 08/2020  ? ? ?Patient Active Problem List  ? Diagnosis Date Noted  ? Anxiety 12/02/2020  ? Hypercholesterolemia 12/02/2020  ? Bilateral carpal tunnel syndrome 11/01/2017  ? ? ?Past Surgical History:  ?Procedure Laterality Date  ? BILATERAL CARPAL TUNNEL RELEASE Bilateral   ? ? ?OB History   ?No obstetric history on file. ?  ? ? ? ?Home Medications   ? ?Prior to Admission medications   ?Medication Sig Start Date End Date Taking? Authorizing Provider  ?albuterol (VENTOLIN HFA) 108 (90 Base) MCG/ACT inhaler Inhale into the lungs. 08/22/20   [provider]  ?Cholecalciferol 25 MCG (1000 UT) tablet Take by mouth.    [provider]  ?PARoxetine (PAXIL) 20 MG tablet Take 1 tablet by mouth daily. 04/18/20   [provider]  ?rosuvastatin (CRESTOR) 20 MG tablet Take by mouth. 04/18/20   [provider]  ? ? ?Family History ?Family History  ?Problem Relation Age of Onset  ? Healthy Mother   ? Lymphoma Father   ? ? ?Social History ?Social History  ? ?Tobacco Use  ? Smoking status: Never  ? Smokeless tobacco: Never  ?Vaping Use  ? Vaping Use: Never used  ?Substance Use Topics  ? Alcohol use: Not Currently  ? Drug use: Never  ? ? ? ?Allergies   ?Patient has no known allergies. ? ? ?Review of Systems ?Review of Systems ?See HPI ? ?Physical Exam ?Triage Vital Signs ?ED Triage Vitals  ?Enc Vitals Group  ?   BP 10/25/21 1055 132/81  ?   Pulse Rate 10/25/21 1055 73  ?   Resp 10/25/21 1055 18  ?   Temp 10/25/21 1055 98.3 ?F  (36.8 ?C)  ?   Temp Source 10/25/21 1055 Oral  ?   SpO2 10/25/21 1055 97 %  ?   Weight --   ?   Height --   ?   Head Circumference --   ?   Peak Flow --   ?   Pain Score 10/25/21 1056 4  ?   Pain Loc --   ?   Pain Edu? --   ?   Excl. in [place]? --   ? ?No data found. ? ?Updated Vital Signs ?BP 132/81 (BP Location: Left Arm)   Pulse 73   Temp 98.3 ?F (36.8 ?C) (Oral)   Resp 18   SpO2 97%  ? ?   ? ?Physical Exam ?Constitutional:   ?   General: She is not in acute distress. ?   Appearance: She is well-developed.  ?HENT:  ?   Head: Normocephalic and atraumatic.  ?Eyes:  ?   Conjunctiva/sclera: Conjunctivae normal.  ?   Pupils: Pupils are equal, round, and reactive to light.  ?Cardiovascular:  ?   Rate and Rhythm: Normal rate.  ?Pulmonary:  ?   Effort: Pulmonary effort is normal. No respiratory distress.  ?Abdominal:  ?   General: There is  no distension.  ?   Palpations: Abdomen is soft.  ?Musculoskeletal:     ?   General: Normal range of motion.  ?   Cervical back: Normal range of motion.  ?   Comments: There is swelling and tenderness around the distal radius, scaphoid, and thumb CMC joint regions.  Ecchymosis is faint.  Range of motion is limited secondary to pain.  Grip strength is limited secondary to pain.  ?Skin: ?   General: Skin is warm and dry.  ?Neurological:  ?   General: No focal deficit present.  ?   Mental Status: She is alert.  ?   Sensory: No sensory deficit.  ?   Coordination: Coordination normal.  ?Psychiatric:     ?   Mood and Affect: Mood normal.     ?   Behavior: Behavior normal.  ? ? ? ?UC Treatments / Results  ?Labs ?(all labs ordered are listed, but only abnormal results are displayed) ?Labs Reviewed - No data to display ? ?EKG ? ? ?Radiology ?DG Wrist Complete Right ? ?Result Date: 10/25/2021 ?CLINICAL DATA:  Injury EXAM: RIGHT WRIST - COMPLETE 3+ VIEW COMPARISON:  None Available. FINDINGS: Normal alignment. No acute fracture. Moderate degenerative changes of the first [facility] joint. The soft tissues  are unremarkable. IMPRESSION: No malalignment or acute fracture. Moderate degenerative changes of the first [facility] joint. Electronically Signed   By: Albin Felling M.D.   On: 10/25/2021 11:15   ? ?Procedures ?Procedures (including critical care time) ? ?Medications Ordered in UC ?Medications - No data to display ? ?Initial Impression / Assessment and Plan / UC Course  ?I have reviewed the triage vital signs and the nursing notes. ? ?Pertinent labs & imaging results that were available during my care of the patient were reviewed by me and considered in my medical decision making (see chart for details). ? ?  ? ?Final Clinical Impressions(s) / UC Diagnoses  ? ?Final diagnoses:  ?Sprain of right wrist, initial encounter  ?Primary osteoarthritis of first carpometacarpal joint of right hand  ?Right wrist pain  ? ? ? ?Discharge Instructions   ? ?  ?Wear brace for comfort ?Use ice and elevate to reduce pain and swelling ?Take ibuprofen 600 mg up to 3 times a day with food as needed for pain ? ? ?ED Prescriptions   ?None ?  ? ?PDMP not reviewed this encounter. ?  ?Raylene Everts, MD ?10/25/21 1205 ? ?

## 2021-10-25 NOTE — Discharge Instructions (Signed)
Wear brace for comfort ?Use ice and elevate to reduce pain and swelling ?Take ibuprofen 600 mg up to 3 times a day with food as needed for pain ?

## 2022-03-09 ENCOUNTER — Encounter: Payer: Self-pay | Admitting: Emergency Medicine

## 2022-03-09 ENCOUNTER — Ambulatory Visit (INDEPENDENT_AMBULATORY_CARE_PROVIDER_SITE_OTHER): Payer: No Typology Code available for payment source

## 2022-03-09 ENCOUNTER — Ambulatory Visit
Admission: EM | Admit: 2022-03-09 | Discharge: 2022-03-09 | Disposition: A | Discharge status: Home / Self Care | Payer: No Typology Code available for payment source | Attending: Physician Assistant | Admitting: Physician Assistant

## 2022-03-09 DIAGNOSIS — R059 Cough, unspecified: Secondary | ICD-10-CM

## 2022-03-09 DIAGNOSIS — R911 Solitary pulmonary nodule: Secondary | ICD-10-CM | POA: Diagnosis not present

## 2022-03-09 DIAGNOSIS — J189 Pneumonia, unspecified organism: Secondary | ICD-10-CM

## 2022-03-09 MED ORDER — AMOXICILLIN-POT CLAVULANATE 125-31.25 MG/5ML PO SUSR: 125.0000 mg | Freq: Two times a day (BID) | ORAL | 0 refills | Status: AC

## 2022-03-09 NOTE — ED Provider Notes (Signed)
[facility]    CSN: [id number] Arrival date & time: 03/09/22  1238      History   Chief Complaint Chief Complaint  Patient presents with   Cough    HPI Jenny Lucas is a 66 y.o. female.   Patient reports she has had a cough for over a month.  Patient reports cough is productive.  She reports having similar symptoms in the past and was diagnosed with pneumonia.  Patient has had 2 negative home COVID test.  Patient denies any fever or chills she denies any current shortness of breath she does have some discomfort in her chest when breathing.   Cough Cough characteristics:  Productive Severity:  Moderate Onset quality:  Gradual Duration:  4 weeks Timing:  Constant Progression:  Worsening Chronicity:  New Smoker: no   Relieved by:  Home nebulizer Worsened by:  Nothing Ineffective treatments:  None tried   Past Medical History:  Diagnosis Date   COVID-19 08/2020    Patient Active Problem List   Diagnosis Date Noted   Anxiety 12/02/2020   Hypercholesterolemia 12/02/2020   Bilateral carpal tunnel syndrome 11/01/2017    Past Surgical History:  Procedure Laterality Date   BILATERAL CARPAL TUNNEL RELEASE Bilateral     OB History   No obstetric history on file.      Home Medications    Prior to Admission medications   Medication Sig Start Date End Date Taking? Authorizing Provider  amoxicillin-clavulanate (AUGMENTIN) 125-31.25 MG/5ML suspension Take 5 mLs (125 mg total) by mouth 2 (two) times daily for 10 days. 03/09/22 03/19/22 Yes Cheron Schaumann K, PA-C  albuterol (VENTOLIN HFA) 108 (90 Base) MCG/ACT inhaler Inhale into the lungs. 08/22/20   [provider]  Cholecalciferol 25 MCG (1000 UT) tablet Take by mouth.    [provider]  PARoxetine (PAXIL) 20 MG tablet Take 1 tablet by mouth daily. 04/18/20   [provider]  rosuvastatin (CRESTOR) 20 MG tablet Take by mouth. 04/18/20   [provider]    Family  History Family History  Problem Relation Age of Onset   Healthy Mother    Lymphoma Father     Social History Social History   Tobacco Use   Smoking status: Never   Smokeless tobacco: Never  Vaping Use   Vaping Use: Never used  Substance Use Topics   Alcohol use: Not Currently   Drug use: Never     Allergies   Patient has no known allergies.   Review of Systems Review of Systems  Respiratory:  Positive for cough.   All other systems reviewed and are negative.    Physical Exam Triage Vital Signs ED Triage Vitals [03/09/22 1319]  Enc Vitals Group     BP (!) 146/79     Pulse Rate 86     Resp 16     Temp 98.8 F (37.1 C)     Temp Source Oral     SpO2 97 %     Weight      Height      Head Circumference      Peak Flow      Pain Score 0     Pain Loc      Pain Edu?      Excl. in GC?    No data found.  Updated Vital Signs BP (!) 146/79 (BP Location: Right Arm)   Pulse 86   Temp 98.8 F (37.1 C) (Oral)   Resp 16  SpO2 97%   Visual Acuity Right Eye Distance:   Left Eye Distance:   Bilateral Distance:    Right Eye Near:   Left Eye Near:    Bilateral Near:     Physical Exam Vitals and nursing note reviewed.  Constitutional:      Appearance: She is well-developed.  HENT:     Head: Normocephalic.     Nose: Nose normal.     Mouth/Throat:     Mouth: Mucous membranes are moist.  Cardiovascular:     Rate and Rhythm: Normal rate.  Pulmonary:     Effort: Pulmonary effort is normal.     Breath sounds: Rhonchi present.  Abdominal:     General: Abdomen is flat. There is no distension.  Musculoskeletal:        General: Normal range of motion.     Cervical back: Normal range of motion.  Skin:    General: Skin is warm.  Neurological:     Mental Status: She is alert and oriented to person, place, and time.      UC Treatments / Results  Labs (all labs ordered are listed, but only abnormal results are displayed) Labs Reviewed - No data to  display  EKG   Radiology DG Chest 2 View  Result Date: 03/09/2022 CLINICAL DATA:  Nodular density at the left lung base. EXAM: CHEST - 2 VIEW COMPARISON:  Film earlier today at 1444 hours FINDINGS: Repeat film with nipple markers demonstrates that the opacity at the left lung base seen previously does not correspond to a nipple shadow but also is no longer definitively seen and therefore not of concern. Opacity at the right lung base remains and is consistent with either atelectasis or early infiltrate. IMPRESSION: 1. The opacity at the left lung base seen previously does not correspond to a nipple shadow but is also no longer visualized and is therefore not of concern. 2. Atelectasis versus early infiltrate at the right lung base. Electronically Signed   By: Irish Lack M.D.   On: 03/09/2022 15:36   DG Chest 2 View  Result Date: 03/09/2022 CLINICAL DATA:  Provided history: Cough. Productive cough. EXAM: CHEST - 2 VIEW COMPARISON:  Prior chest radiographs 12/02/2020 and earlier. FINDINGS: Heart size within normal limits. Ill-defined opacity within the right lung base. A 7 mm nodular opacity projects in the region of the left lung base on the PA radiograph. No evidence of pleural effusion or pneumothorax. No acute bony abnormality identified. These results will be called to the ordering clinician or representative by the Radiologist Assistant, and communication documented in the PACS or [employer]. IMPRESSION: Ill-defined opacity within the right lung base, suspicious for pneumonia given the provided history. Followup PA and lateral chest radiographs are recommended in 3-4 weeks following trial of antibiotic therapy to ensure resolution and exclude underlying malignancy. A 7 mm nodular opacity projects in the region of the left lung base on the PA radiograph. This may reflect an asymmetric nipple shadow. However, a pulmonary nodule cannot be excluded. A repeat PA chest radiograph with nipple  markers is recommended. If this finding does not correspond with the left-sided nipple marker on this follow-up examination, a chest CT is recommended for further evaluation. Electronically Signed   By: Jackey Loge D.O.   On: 03/09/2022 14:43    Procedures Procedures (including critical care time)  Medications Ordered in UC Medications - No data to display  Initial Impression / Assessment and Plan / UC Course  I  have reviewed the triage vital signs and the nursing notes.  Pertinent labs & imaging results that were available during my care of the patient were reviewed by me and considered in my medical decision making (see chart for details).     MDM: Chest x-ray showed a possible pulmonary nodule and radiologist report quested repeat with nipple markers.  Repeat chest x-ray was done which shows no evidence of pulmonary nodule.  X-ray does show atelectasis versus infiltrate on the right side.  I discussed the results with the patient patient is given a prescription for Augmentin she is advised to follow-up with her primary care physician for recheck she is to return if symptoms worsen or change Final Clinical Impressions(s) / UC Diagnoses   Final diagnoses:  Pneumonia due to infectious organism, unspecified laterality, unspecified part of lung     Discharge Instructions      See your Physician for recheck.  Return if any problems.    ED Prescriptions     Medication Sig Dispense Auth. Provider   amoxicillin-clavulanate (AUGMENTIN) 125-31.25 MG/5ML suspension Take 5 mLs (125 mg total) by mouth 2 (two) times daily for 10 days. 100 mL [place]ansico Meadow, [place]      PDMP not reviewed this encounter. An After Visit Summary was printed and given to the patient.    Fransico Meadow, [place] 03/09/22 1605

## 2022-03-09 NOTE — Discharge Instructions (Addendum)
See your Physician for recheck.  Return if any problems.  °

## 2022-03-09 NOTE — ED Triage Notes (Signed)
Pt states she has been coughing for almost 1 month. Cough is productive and states it is not improving. She has had x2 negative covid tests. Taking zyrtec and advil.

## 2022-03-10 ENCOUNTER — Telehealth: Payer: Self-pay | Admitting: Emergency Medicine

## 2022-04-19 ENCOUNTER — Other Ambulatory Visit: Payer: Self-pay | Admitting: Gynecology

## 2022-04-19 DIAGNOSIS — Z1231 Encounter for screening mammogram for malignant neoplasm of breast: Secondary | ICD-10-CM

## 2022-04-25 ENCOUNTER — Ambulatory Visit: Payer: No Typology Code available for payment source

## 2022-11-05 IMAGING — DX DG WRIST COMPLETE 3+V*R*
4 series · 4 of 4 positions shown · non-contrast
Comparison: None Available.

CLINICAL DATA: Injury

EXAM:
RIGHT WRIST - COMPLETE 3+ VIEW

[wrist pa]
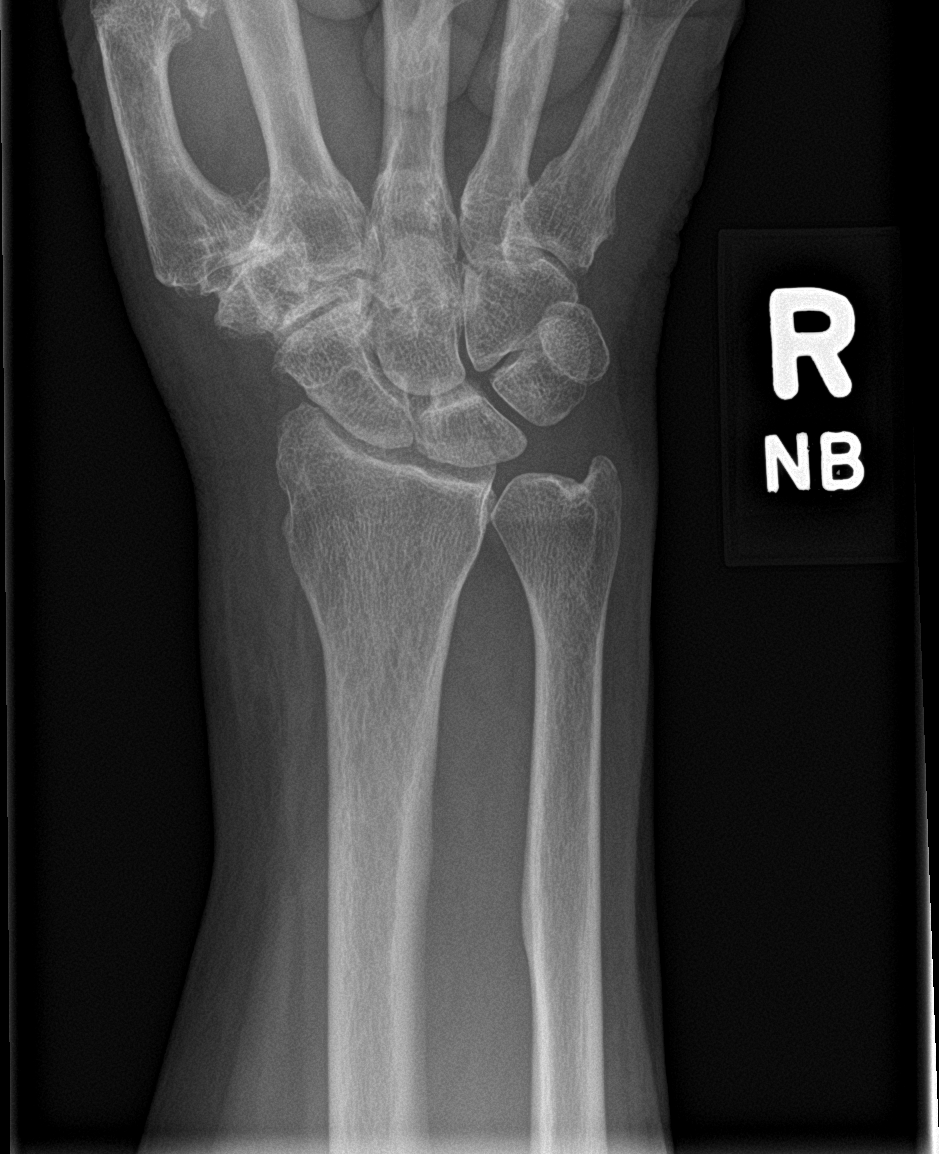

[wrist obl]
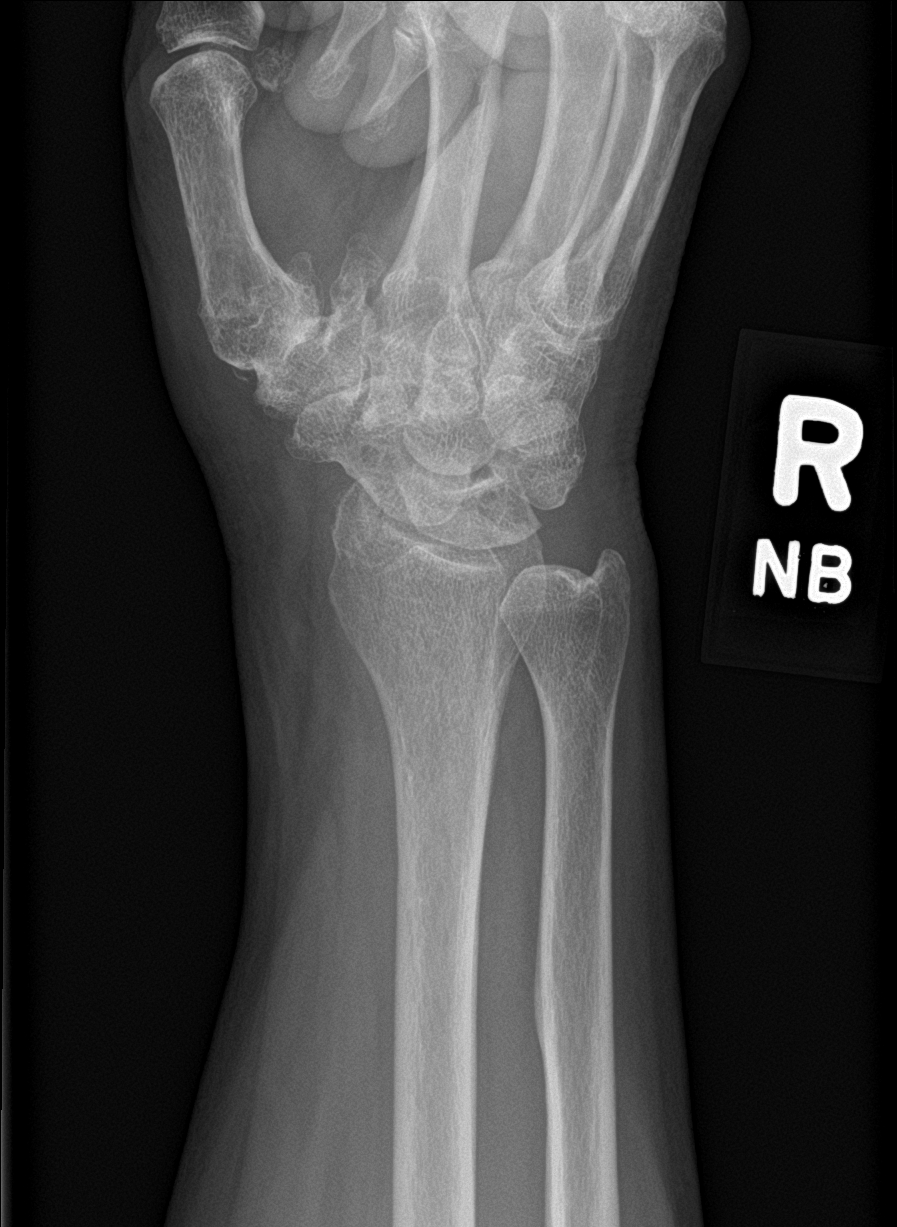

[wrist lat]
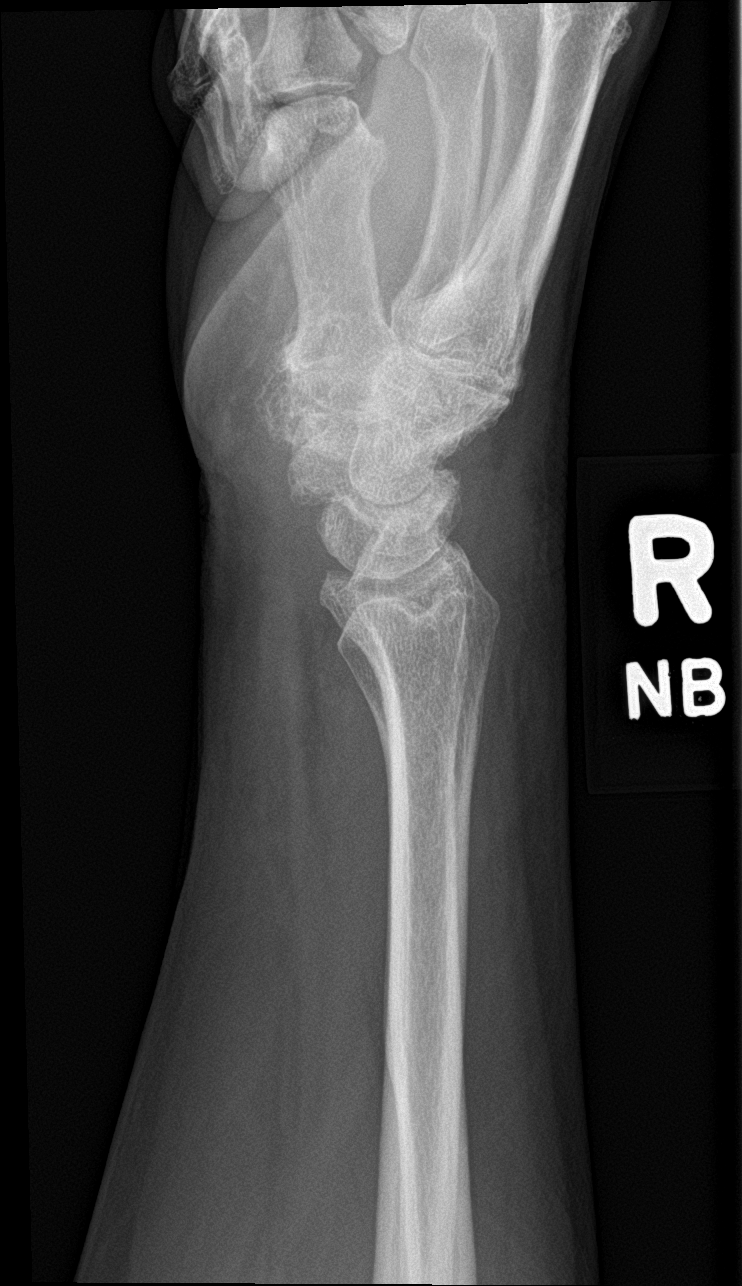

[wrist navicular]
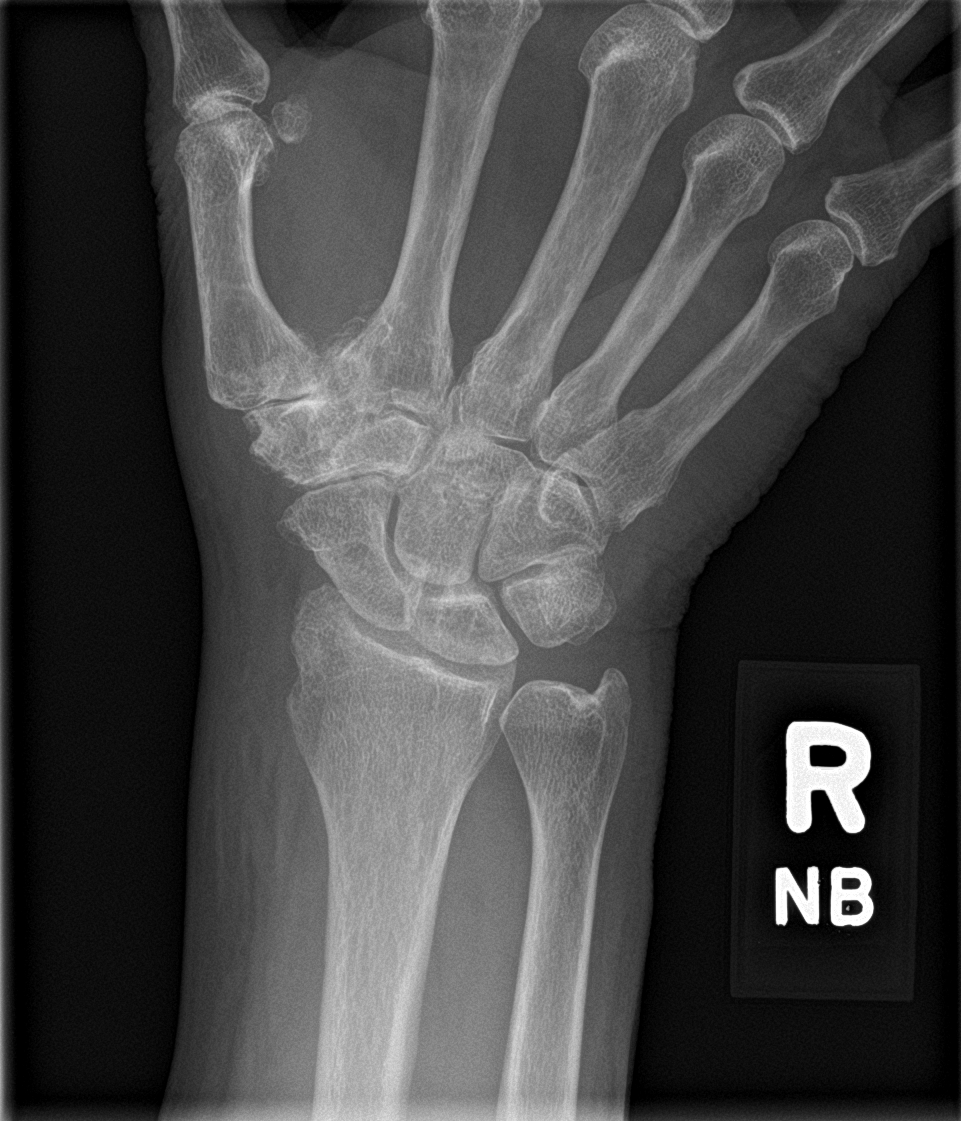

[4 of 4 positions shown; findings below may reference images not displayed]

FINDINGS: Normal alignment. No acute fracture. Moderate degenerative changes
of the first CMC joint. The soft tissues are unremarkable.
IMPRESSION: No malalignment or acute fracture. Moderate degenerative changes of
the first CMC joint.

## 2023-06-11 ENCOUNTER — Ambulatory Visit
Admission: EM | Admit: 2023-06-11 | Discharge: 2023-06-11 | Disposition: A | Discharge status: Home / Self Care | Payer: No Typology Code available for payment source | Attending: Family Medicine | Admitting: Family Medicine

## 2023-06-11 DIAGNOSIS — J0111 Acute recurrent frontal sinusitis: Secondary | ICD-10-CM

## 2023-06-11 HISTORY — DX: Pure hypercholesterolemia, unspecified: E78.00

## 2023-06-11 MED ORDER — PREDNISONE 20 MG PO TABS: 40.0000 mg | ORAL_TABLET | Freq: Every day | ORAL | 0 refills | Status: DC

## 2023-06-11 MED ORDER — AMOXICILLIN-POT CLAVULANATE 875-125 MG PO TABS: 1.0000 | ORAL_TABLET | Freq: Two times a day (BID) | ORAL | 0 refills | Status: DC

## 2023-06-11 NOTE — Discharge Instructions (Signed)
Take the antibiotic 2 times a day.  Take this antibiotic with food Take the prednisone once a day. Drink lots of fluids Continue over-the-counter cough or cold medicines See your doctor if not improving by Monday

## 2023-06-11 NOTE — ED Provider Notes (Signed)
 Jenny Lucas CARE    CSN: [id number] Arrival date & time: 06/11/23  1316      History   Chief Complaint Chief Complaint  Patient presents with   Facial Pain    HPI Jenny Lucas is a 67 y.o. female.   HPI Patient states she gets sinus infections every year.  This time she has had infection symptoms for 5 to 6 days.  Sinus pressure and pain.  Yellow drainage.  Fatigue.  Eye pressure and pain.  Some cough.  Minor sore throat.  No fever or chills.  She states she has tried over-the-counter medication.  She feels she is at the point that she will need an antibiotic in order to get better. Past Medical History:  Diagnosis Date   COVID-19 08/2020   High cholesterol     Patient Active Problem List   Diagnosis Date Noted   Anxiety 12/02/2020   Hypercholesterolemia 12/02/2020   Bilateral carpal tunnel syndrome 11/01/2017    Past Surgical History:  Procedure Laterality Date   ANKLE ARTHROSCOPY W/ OPEN REPAIR Right    BILATERAL CARPAL TUNNEL RELEASE Bilateral     OB History   No obstetric history on file.      Home Medications    Prior to Admission medications   Medication Sig Start Date End Date Taking? Authorizing Provider  amoxicillin -clavulanate (AUGMENTIN ) 875-125 MG tablet Take 1 tablet by mouth every 12 (twelve) hours. 06/11/23  Yes Maranda Jamee Jacob, MD  predniSONE  (DELTASONE ) 20 MG tablet Take 2 tablets (40 mg total) by mouth daily with breakfast. 06/11/23  Yes Maranda Jamee Jacob, MD  albuterol (VENTOLIN HFA) 108 (90 Base) MCG/ACT inhaler Inhale into the lungs. 08/22/20   [provider]  Cholecalciferol 25 MCG (1000 UT) tablet Take by mouth.    [provider]  PARoxetine (PAXIL) 20 MG tablet Take 1 tablet by mouth daily. 04/18/20   [provider]  rosuvastatin (CRESTOR) 20 MG tablet Take by mouth. 04/18/20   [provider]    Family History Family History  Problem Relation Age of Onset   Healthy Mother     Lymphoma Father     Social History Social History   Tobacco Use   Smoking status: Never   Smokeless tobacco: Never  Vaping Use   Vaping status: Never Used  Substance Use Topics   Alcohol use: Not Currently   Drug use: Never     Allergies   Patient has no known allergies.   Review of Systems Review of Systems  See HPI Physical Exam Triage Vital Signs ED Triage Vitals  Encounter Vitals Group     BP 06/11/23 1450 (!) 160/91     Systolic BP Percentile --      Diastolic BP Percentile --      Pulse Rate 06/11/23 1450 74     Resp 06/11/23 1450 16     Temp 06/11/23 1450 97.9 F (36.6 C)     Temp src --      SpO2 06/11/23 1450 98 %     Weight --      Height --      Head Circumference --      Peak Flow --      Pain Score 06/11/23 1449 0     Pain Loc --      Pain Education --      Exclude from Growth Chart --    No data found.  Updated Vital Signs BP (!) 160/91  Pulse 74   Temp 97.9 F (36.6 C)   Resp 16   SpO2 98%     Physical Exam Constitutional:      General: She is not in acute distress.    Appearance: Normal appearance. She is well-developed. She is ill-appearing.     Comments: Appears ill.  Some puffiness around eyes  HENT:     Head: Normocephalic and atraumatic.     Right Ear: Tympanic membrane and ear canal normal.     Left Ear: Tympanic membrane and ear canal normal.     Nose: Congestion and rhinorrhea present.     Comments: Frontal and maxillary sinuses are tender    Mouth/Throat:     Pharynx: No posterior oropharyngeal erythema.  Eyes:     Conjunctiva/sclera: Conjunctivae normal.     Pupils: Pupils are equal, round, and reactive to light.  Cardiovascular:     Rate and Rhythm: Normal rate.     Heart sounds: Normal heart sounds.  Pulmonary:     Effort: Pulmonary effort is normal. No respiratory distress.     Breath sounds: Normal breath sounds.  Abdominal:     General: There is no distension.     Palpations: Abdomen is soft.   Musculoskeletal:        General: Normal range of motion.     Cervical back: Normal range of motion.  Lymphadenopathy:     Cervical: Cervical adenopathy present.  Skin:    General: Skin is warm and dry.  Neurological:     Mental Status: She is alert.      UC Treatments / Results  Labs (all labs ordered are listed, but only abnormal results are displayed) Labs Reviewed - No data to display  EKG   Radiology No results found.  Procedures Procedures (including critical care time)  Medications Ordered in UC Medications - No data to display  Initial Impression / Assessment and Plan / UC Course  I have reviewed the triage vital signs and the nursing notes.  Pertinent labs & imaging results that were available during my care of the patient were reviewed by me and considered in my medical decision making (see chart for details).     Final Clinical Impressions(s) / UC Diagnoses   Final diagnoses:  Acute recurrent frontal sinusitis     Discharge Instructions      Take the antibiotic 2 times a day.  Take this antibiotic with food Take the prednisone  once a day. Drink lots of fluids Continue over-the-counter cough or cold medicines See your doctor if not improving by Monday   ED Prescriptions     Medication Sig Dispense Auth. Provider   predniSONE  (DELTASONE ) 20 MG tablet Take 2 tablets (40 mg total) by mouth daily with breakfast. 10 tablet Maranda Jamee Jacob, MD   amoxicillin -clavulanate (AUGMENTIN ) 875-125 MG tablet Take 1 tablet by mouth every 12 (twelve) hours. 14 tablet Maranda Jamee Jacob, MD      PDMP not reviewed this encounter.   Maranda Jamee Jacob, MD 06/11/23 [phone]

## 2023-06-11 NOTE — ED Triage Notes (Signed)
Pt presents to [facility] with co of sinus congestion and headache for 5 days. Pt reports she has been taking zytec and otc cough medication.

## 2024-04-17 ENCOUNTER — Ambulatory Visit
Admission: EM | Admit: 2024-04-17 | Discharge: 2024-04-17 | Disposition: A | Discharge status: Home / Self Care | Attending: Internal Medicine | Admitting: Internal Medicine

## 2024-04-17 DIAGNOSIS — J029 Acute pharyngitis, unspecified: Secondary | ICD-10-CM | POA: Diagnosis not present

## 2024-04-17 DIAGNOSIS — R051 Acute cough: Secondary | ICD-10-CM | POA: Diagnosis not present

## 2024-04-17 DIAGNOSIS — J020 Streptococcal pharyngitis: Secondary | ICD-10-CM

## 2024-04-17 DIAGNOSIS — R030 Elevated blood-pressure reading, without diagnosis of hypertension: Secondary | ICD-10-CM

## 2024-04-17 LAB — POCT RAPID STREP A (OFFICE): Rapid Strep A Screen: POSITIVE — AB

## 2024-04-17 LAB — POC SOFIA SARS ANTIGEN FIA: SARS Coronavirus 2 Ag: NEGATIVE

## 2024-04-17 LAB — POCT INFLUENZA A/B
Influenza A, POC: NEGATIVE
Influenza B, POC: NEGATIVE

## 2024-04-17 MED ORDER — BENZONATATE 100 MG PO CAPS: 100.0000 mg | ORAL_CAPSULE | Freq: Three times a day (TID) | ORAL | 0 refills | Status: DC | PRN

## 2024-04-17 MED ORDER — FLUTICASONE PROPIONATE 50 MCG/ACT NA SUSP: 1.0000 | Freq: Every day | NASAL | 0 refills | Status: DC

## 2024-04-17 MED ORDER — AMOXICILLIN 500 MG PO CAPS: 500.0000 mg | ORAL_CAPSULE | Freq: Two times a day (BID) | ORAL | 0 refills | Status: AC

## 2024-04-17 NOTE — ED Triage Notes (Signed)
 Pt c/o cough, congestion and sore throat since yesterday. Denies fever. Taking zyrtec and tylenol prn.

## 2024-04-17 NOTE — ED Provider Notes (Signed)
 Jenny Lucas CARE    CSN: [id number] Arrival date & time: 04/17/24  0946      History   Chief Complaint Chief Complaint  Patient presents with   Cough   Nasal Congestion   Sore Throat    HPI Jenny Lucas is a 68 y.o. female.   Patient presents with cough, nasal congestion, sore throat that started yesterday.  She has been taking Zyrtec and Tylenol with minimal improvement. Denies fever. Reports recurrent strep throat with last infection being 1 year ago. Denies history of asthma, COPD, smoking.  Patient does have elevated blood pressure but denies history of hypertension.  She does not take any daily medications.  She is not reporting any chest pain, shortness of breath, dizziness, headache, blurred vision.   Cough Sore Throat    Past Medical History:  Diagnosis Date   COVID-19 08/2020   High cholesterol     Patient Active Problem List   Diagnosis Date Noted   Anxiety 12/02/2020   Hypercholesterolemia 12/02/2020   Bilateral carpal tunnel syndrome 11/01/2017    Past Surgical History:  Procedure Laterality Date   ANKLE ARTHROSCOPY W/ OPEN REPAIR Right    BILATERAL CARPAL TUNNEL RELEASE Bilateral     OB History   No obstetric history on file.      Home Medications    Prior to Admission medications   Medication Sig Start Date End Date Taking? Authorizing Provider  amoxicillin  (AMOXIL ) 500 MG capsule Take 1 capsule (500 mg total) by mouth 2 (two) times daily for 10 days. 04/17/24 04/27/24 Yes Stanlee Roehrig, Darryle FORBES, FNP  benzonatate (TESSALON) 100 MG capsule Take 1 capsule (100 mg total) by mouth every 8 (eight) hours as needed for cough. 04/17/24  Yes Alayha Babineaux E, FNP  fluticasone (FLONASE) 50 MCG/ACT nasal spray Place 1 spray into both nostrils daily. 04/17/24  Yes Milbert Bixler, Darryle E, FNP  albuterol (VENTOLIN HFA) 108 (90 Base) MCG/ACT inhaler Inhale into the lungs. 08/22/20   [provider]  Cholecalciferol 25 MCG (1000 UT) tablet Take by mouth.     [provider]  PARoxetine (PAXIL) 20 MG tablet Take 1 tablet by mouth daily. 04/18/20   [provider]  predniSONE  (DELTASONE ) 20 MG tablet Take 2 tablets (40 mg total) by mouth daily with breakfast. 06/11/23   Maranda Jamee Jacob, MD  rosuvastatin (CRESTOR) 20 MG tablet Take by mouth. 04/18/20   [provider]    Family History Family History  Problem Relation Age of Onset   Healthy Mother    Lymphoma Father     Social History Social History   Tobacco Use   Smoking status: Never   Smokeless tobacco: Never  Vaping Use   Vaping status: Never Used  Substance Use Topics   Alcohol use: Not Currently   Drug use: Never     Allergies   Patient has no known allergies.   Review of Systems Review of Systems Per HPI  Physical Exam Triage Vital Signs ED Triage Vitals  Encounter Vitals Group     BP 04/17/24 0956 (!) 170/98     Girls Systolic BP Percentile --      Girls Diastolic BP Percentile --      Boys Systolic BP Percentile --      Boys Diastolic BP Percentile --      Pulse Rate 04/17/24 0956 (!) 103     Resp 04/17/24 0956 17     Temp 04/17/24 0956 98.3 F (36.8 C)  Temp Source 04/17/24 0956 Oral     SpO2 04/17/24 0956 94 %     Weight --      Height --      Head Circumference --      Peak Flow --      Pain Score 04/17/24 0957 3     Pain Loc --      Pain Education --      Exclude from Growth Chart --    No data found.  Updated Vital Signs BP (!) 164/85 (BP Location: Right Arm)   Pulse (!) 103   Temp 98.3 F (36.8 C) (Oral)   Resp 17   SpO2 95%   Visual Acuity Right Eye Distance:   Left Eye Distance:   Bilateral Distance:    Right Eye Near:   Left Eye Near:    Bilateral Near:     Physical Exam Constitutional:      General: She is not in acute distress.    Appearance: Normal appearance. She is not toxic-appearing or diaphoretic.  HENT:     Head: Normocephalic and atraumatic.     Right Ear: Tympanic membrane and ear  canal normal.     Left Ear: Tympanic membrane and ear canal normal.     Nose: Congestion present.     Mouth/Throat:     Mouth: Mucous membranes are moist.     Pharynx: Posterior oropharyngeal erythema present. No pharyngeal swelling or oropharyngeal exudate.     Tonsils: No tonsillar exudate or tonsillar abscesses.  Eyes:     Extraocular Movements: Extraocular movements intact.     Conjunctiva/sclera: Conjunctivae normal.     Pupils: Pupils are equal, round, and reactive to light.  Cardiovascular:     Rate and Rhythm: Normal rate and regular rhythm.     Pulses: Normal pulses.     Heart sounds: Normal heart sounds.  Pulmonary:     Effort: Pulmonary effort is normal. No respiratory distress.     Breath sounds: Normal breath sounds. No stridor. No wheezing, rhonchi or rales.  Musculoskeletal:        General: Normal range of motion.     Cervical back: Normal range of motion.  Skin:    General: Skin is warm and dry.  Neurological:     General: No focal deficit present.     Mental Status: She is alert and oriented to person, place, and time. Mental status is at baseline.  Psychiatric:        Mood and Affect: Mood normal.        Behavior: Behavior normal.      UC Treatments / Results  Labs (all labs ordered are listed, but only abnormal results are displayed) Labs Reviewed  POCT RAPID STREP A (OFFICE) - Abnormal; Notable for the following components:      Result Value   Rapid Strep A Screen Positive (*)    All other components within normal limits  POC SOFIA SARS ANTIGEN FIA  POCT INFLUENZA A/B    EKG   Radiology No results found.  Procedures Procedures (including critical care time)  Medications Ordered in UC Medications - No data to display  Initial Impression / Assessment and Plan / UC Course  I have reviewed the triage vital signs and the nursing notes.  Pertinent labs & imaging results that were available during my care of the patient were reviewed by me and  considered in my medical decision making (see chart for details).     Rapid  strep was positive.  Will treat with amoxicillin .  No signs of peritonsillar abscess on exam.  Advised supportive care including adequate fluids and rest.  Blood pressure elevated on exam but patient denies history of hypertension.  Advised to monitor at home and follow-up with PCP if it remains elevated.  There are no signs of hypertensive urgency on exam but she was advised of strict ER precautions.  Heart rate mildly elevated but was normal on physical exam.  Advised strict follow-up precautions.  Patient verbalized understanding and was agreeable with plan. Final Clinical Impressions(s) / UC Diagnoses   Final diagnoses:  Sore throat  Strep pharyngitis  Acute cough  Elevated blood pressure reading     Discharge Instructions      You have strep so I have sent an antibiotic to help treat this.  I also prescribed Flonase and a cough medicine.  Ensure adequate fluids and rest.  Follow-up if any symptoms persist or worsen.    ED Prescriptions     Medication Sig Dispense Auth. Provider   benzonatate (TESSALON) 100 MG capsule Take 1 capsule (100 mg total) by mouth every 8 (eight) hours as needed for cough. 21 capsule Semmes, Beckham Buxbaum E, FNP   fluticasone [facility]) 50 MCG/ACT nasal spray Place 1 spray into both nostrils daily. 16 g Broxton, Thermal E, FNP   amoxicillin  (AMOXIL ) 500 MG capsule Take 1 capsule (500 mg total) by mouth 2 (two) times daily for 10 days. 20 capsule Zaelyn Noack E, [place]      PDMP not reviewed this encounter.   Hazen Darryle BRAVO, [place] 04/17/24 1108

## 2024-04-17 NOTE — Discharge Instructions (Signed)
 You have strep so I have sent an antibiotic to help treat this.  I also prescribed Flonase and a cough medicine.  Ensure adequate fluids and rest.  Follow-up if any symptoms persist or worsen.

## 2024-05-28 ENCOUNTER — Ambulatory Visit

## 2024-05-28 ENCOUNTER — Other Ambulatory Visit: Payer: Self-pay

## 2024-05-28 ENCOUNTER — Ambulatory Visit
Admission: EM | Admit: 2024-05-28 | Discharge: 2024-05-28 | Disposition: A | Discharge status: Home / Self Care | Source: Home / Self Care | Attending: Family Medicine | Admitting: Family Medicine

## 2024-05-28 DIAGNOSIS — M25562 Pain in left knee: Secondary | ICD-10-CM

## 2024-05-28 DIAGNOSIS — M238X9 Other internal derangements of unspecified knee: Secondary | ICD-10-CM | POA: Diagnosis not present

## 2024-05-28 MED ORDER — METHYLPREDNISOLONE 4 MG PO TBPK: ORAL_TABLET | ORAL | 0 refills | Status: AC

## 2024-05-28 NOTE — Discharge Instructions (Signed)
 Use ice for 20 minutes every few hours to reduce pain and swelling Take the Medrol  pack as directed.  Take all of day 1 today.  You can take 6 pills as your initial dose to load up on the medication.  After that take per schedule When you have finished the steroid pack take Aleve 2 in the morning and 2 at night until your knee pain has resolved If you fail to see improvement, see Dr. Sharlene and sports medicine Wear the brace until you can walk comfortably without it

## 2024-05-28 NOTE — ED Triage Notes (Addendum)
 C/o left leg pain x 2 weeks. Started slowly as an aching and difficulty bearing weight. Has gotten worse over the past few days, worse after sitting and trying to get up. No falls or trauma. Has had advil.

## 2024-05-28 NOTE — ED Provider Notes (Signed)
 Jenny Lucas CARE    CSN: [id number] Arrival date & time: 05/28/24  1343      History   Chief Complaint Chief Complaint  Patient presents with   Leg Pain    HPI Jenny Lucas is a 68 y.o. female.   Patient is having worsening pain in her left leg on the outside of the knee area over the last couple of weeks.  She states sometimes when she just moves the knee it will feel pain.  When she puts her weight on it at times it feels like it is going to buckle.  She has had no trauma or injury.  No change in activity.  No old trauma.  No swelling    Past Medical History:  Diagnosis Date   Anxiety    COVID-19 08/2020   High cholesterol     Patient Active Problem List   Diagnosis Date Noted   Anxiety 12/02/2020   Hypercholesterolemia 12/02/2020   Bilateral carpal tunnel syndrome 11/01/2017    Past Surgical History:  Procedure Laterality Date   ANKLE ARTHROSCOPY W/ OPEN REPAIR Right    BILATERAL CARPAL TUNNEL RELEASE Bilateral     OB History   No obstetric history on file.      Home Medications    Prior to Admission medications  Medication Sig Start Date End Date Taking? Authorizing Provider  methylPREDNISolone  (MEDROL  DOSEPAK) 4 MG TBPK tablet tad 05/28/24  Yes Maranda Jamee Jacob, MD  Cholecalciferol 25 MCG (1000 UT) tablet Take by mouth.    [provider]  PARoxetine (PAXIL) 20 MG tablet Take 1 tablet by mouth daily. 04/18/20   [provider]  rosuvastatin (CRESTOR) 20 MG tablet Take by mouth. 04/18/20   [provider]    Family History Family History  Problem Relation Age of Onset   Healthy Mother    Lymphoma Father     Social History Social History[1]   Allergies   Patient has no known allergies.   Review of Systems Review of Systems See HPI  Physical Exam Triage Vital Signs ED Triage Vitals  Encounter Vitals Group     BP 05/28/24 1347 (!) 177/81     Girls Systolic BP Percentile --      Girls Diastolic  BP Percentile --      Boys Systolic BP Percentile --      Boys Diastolic BP Percentile --      Pulse Rate 05/28/24 1347 78     Resp 05/28/24 1347 16     Temp 05/28/24 1347 97.8 F (36.6 C)     Temp src --      SpO2 05/28/24 1347 98 %     Weight --      Height --      Head Circumference --      Peak Flow --      Pain Score 05/28/24 1351 5     Pain Loc --      Pain Education --      Exclude from Growth Chart --    No data found.  Updated Vital Signs BP (!) 177/81   Pulse 78   Temp 97.8 F (36.6 C)   Resp 16   SpO2 98%       Physical Exam Constitutional:      General: She is not in acute distress.    Appearance: She is well-developed.  HENT:     Head: Normocephalic and atraumatic.  Eyes:     Conjunctiva/sclera: Conjunctivae  normal.     Pupils: Pupils are equal, round, and reactive to light.  Cardiovascular:     Rate and Rhythm: Normal rate.  Pulmonary:     Effort: Pulmonary effort is normal. No respiratory distress.  Abdominal:     General: There is no distension.     Palpations: Abdomen is soft.  Musculoskeletal:        General: Tenderness present. No swelling. Normal range of motion.     Cervical back: Normal range of motion.     Comments: There is a palpable click with knee extension.  Pain with passive and active knee motion.  Resistance to full knee extension.  Tenderness around medial rather than lateral joint line.  No instability to exam.  Warmth but no effusion.  Skin:    General: Skin is warm and dry.  Neurological:     Mental Status: She is alert.     Gait: Gait abnormal.      UC Treatments / Results  Labs (all labs ordered are listed, but only abnormal results are displayed) Labs Reviewed - No data to display  EKG   Radiology DG Knee AP/LAT W/Sunrise Left Result Date: 05/28/2024 CLINICAL DATA:  acute pain EXAM: LEFT KNEE 3 VIEWS COMPARISON:  None Available. FINDINGS: No acute fracture or dislocation. Small joint effusion. Mild  patellofemoral joint space loss. Soft tissues are unremarkable. IMPRESSION: 1. Small joint effusion.  No acute fracture or dislocation. 2. Mild patellofemoral joint space loss, likely degenerative. Electronically Signed   By: Rogelia Myers M.D.   On: 05/28/2024 15:00    Procedures Procedures (including critical care time)  Medications Ordered in UC Medications - No data to display  Initial Impression / Assessment and Plan / UC Course  I have reviewed the triage vital signs and the nursing notes.  Pertinent labs & imaging results that were available during my care of the patient were reviewed by me and considered in my medical decision making (see chart for details).     Discussed with patient that her buckling and pain are suspicious for internal derangement.  Will treat initially with anti-inflammatories ice and rest.  Follow-up with sports medicine if fails to improve.  X-ray discussed with patient. Final Clinical Impressions(s) / UC Diagnoses   Final diagnoses:  Acute pain of left knee  Patellofemoral crepitus     Discharge Instructions      Use ice for 20 minutes every few hours to reduce pain and swelling Take the Medrol  pack as directed.  Take all of day 1 today.  You can take 6 pills as your initial dose to load up on the medication.  After that take per schedule When you have finished the steroid pack take Aleve 2 in the morning and 2 at night until your knee pain has resolved If you fail to see improvement, see Dr. Sharlene and sports medicine Wear the brace until you can walk comfortably without it     ED Prescriptions     Medication Sig Dispense Auth. Provider   methylPREDNISolone  (MEDROL  DOSEPAK) 4 MG TBPK tablet tad 21 tablet Maranda Jamee Jacob, MD      PDMP not reviewed this encounter.     [1]  Social History Tobacco Use   Smoking status: Never   Smokeless tobacco: Never  Vaping Use   Vaping status: Never Used  Substance Use Topics   Alcohol  use: Not Currently   Drug use: Never     Maranda Jamee Jacob, MD 05/28/24 [phone]

## 2024-05-29 ENCOUNTER — Telehealth: Payer: Self-pay

## 2024-06-12 ENCOUNTER — Other Ambulatory Visit (HOSPITAL_COMMUNITY): Payer: Self-pay | Admitting: Medical

## 2024-06-12 ENCOUNTER — Ambulatory Visit (HOSPITAL_COMMUNITY)
Admission: RE | Admit: 2024-06-12 | Discharge: 2024-06-12 | Disposition: A | Discharge status: Home / Self Care | Source: Ambulatory Visit | Attending: Vascular Surgery | Admitting: Vascular Surgery

## 2024-06-12 DIAGNOSIS — M25562 Pain in left knee: Secondary | ICD-10-CM | POA: Insufficient documentation
